# Patient Record
Sex: Male | Born: 1970 | Hispanic: No | Marital: Single | Smoking: Current every day smoker
Health system: Southern US, Community
[De-identification: ages and names within clinical notes are randomized; demographics above are authoritative.]

## PROBLEM LIST (undated history)

## (undated) DIAGNOSIS — E119 Type 2 diabetes mellitus without complications: Secondary | ICD-10-CM

---

## 2018-12-27 ENCOUNTER — Encounter (HOSPITAL_COMMUNITY): Payer: Self-pay | Admitting: Emergency Medicine

## 2018-12-27 ENCOUNTER — Emergency Department (HOSPITAL_COMMUNITY): Payer: Self-pay

## 2018-12-27 ENCOUNTER — Emergency Department (HOSPITAL_COMMUNITY)
Admission: EM | Admit: 2018-12-27 | Discharge: 2018-12-27 | Disposition: A | Discharge status: Home / Self Care | Payer: Self-pay | Attending: Emergency Medicine | Admitting: Emergency Medicine

## 2018-12-27 ENCOUNTER — Other Ambulatory Visit: Payer: Self-pay

## 2018-12-27 DIAGNOSIS — S92022A Displaced fracture of anterior process of left calcaneus, initial encounter for closed fracture: Secondary | ICD-10-CM | POA: Insufficient documentation

## 2018-12-27 DIAGNOSIS — F17210 Nicotine dependence, cigarettes, uncomplicated: Secondary | ICD-10-CM | POA: Insufficient documentation

## 2018-12-27 DIAGNOSIS — Y999 Unspecified external cause status: Secondary | ICD-10-CM | POA: Insufficient documentation

## 2018-12-27 DIAGNOSIS — Y929 Unspecified place or not applicable: Secondary | ICD-10-CM | POA: Insufficient documentation

## 2018-12-27 DIAGNOSIS — W132XXA Fall from, out of or through roof, initial encounter: Secondary | ICD-10-CM | POA: Insufficient documentation

## 2018-12-27 DIAGNOSIS — S92002A Unspecified fracture of left calcaneus, initial encounter for closed fracture: Secondary | ICD-10-CM

## 2018-12-27 DIAGNOSIS — E119 Type 2 diabetes mellitus without complications: Secondary | ICD-10-CM | POA: Insufficient documentation

## 2018-12-27 DIAGNOSIS — Y9389 Activity, other specified: Secondary | ICD-10-CM | POA: Insufficient documentation

## 2018-12-27 HISTORY — DX: Type 2 diabetes mellitus without complications: E11.9

## 2018-12-27 MED ORDER — IBUPROFEN 200 MG PO TABS
600.0000 mg | ORAL_TABLET | Freq: Once | ORAL | Status: AC
  Administered 2018-12-27: 600 mg via ORAL
  Filled 2018-12-27: qty 3

## 2018-12-27 MED ORDER — OXYCODONE-ACETAMINOPHEN 5-325 MG PO TABS
1.0000 | ORAL_TABLET | Freq: Once | ORAL | Status: AC
  Administered 2018-12-27: 1 via ORAL
  Filled 2018-12-27: qty 1

## 2018-12-27 MED ORDER — OXYCODONE-ACETAMINOPHEN 5-325 MG PO TABS: 1.0000 | ORAL_TABLET | ORAL | 0 refills | Status: AC | PRN

## 2018-12-27 NOTE — ED Provider Notes (Signed)
[facility]-EMERGENCY DEPT Provider Note   CSN: [id number] Arrival date & time: 12/27/18  0813     History   Chief Complaint Chief Complaint  Patient presents with  . Fall  . Foot Injury    HPI Nicholas Richardson is a 48 y.o. male.     HPI Patient is a 48 year old male who presents to the emergency department complaints of left ankle and heel pain.  He fell from a roof while attempting to access the roof by ladder when the ladder slid out.  He fell onto his left ankle.  He also developed abrasions to his right knee and his right forearm.  No head injury.  No back pain.  No weakness of his arms or legs.  No hip or knee discomfort.  Denies pain in his upper extremities.  His pain is focused and localized in the left foot and ankle.  His pain is moderate in severity.  Past Medical History:  Diagnosis Date  . Diabetes mellitus without complication (HCC)     There are no active problems to display for this patient.   History reviewed. No pertinent surgical history.      Home Medications    Prior to Admission medications   Medication Sig Start Date End Date Taking? Authorizing Provider  glipiZIDE (GLUCOTROL XL) 10 MG 24 hr tablet Take 10 mg by mouth 2 (two) times a day. 12/21/18  Yes [provider]  metFORMIN (GLUCOPHAGE) 1000 MG tablet Take 1,000 mg by mouth 2 (two) times a day. 12/21/18  Yes [provider]  oxyCODONE-acetaminophen (PERCOCET/ROXICET) 5-325 MG tablet Take 1 tablet by mouth every 4 (four) hours as needed for severe pain. 12/27/18   Azalia Bilisampos, Hailea Eaglin, MD    Family History No family history on file.  Social History Social History   Tobacco Use  . Smoking status: Current Every Day Smoker    Types: Cigarettes  . Smokeless tobacco: Never Used  Substance Use Topics  . Alcohol use: Not on file  . Drug use: Not on file     Allergies   Penicillins   Review of Systems Review of Systems  All other systems reviewed and are  negative.    Physical Exam Updated Vital Signs BP (!) 144/84   Pulse 93   Temp 98.8 F (37.1 C) (Oral)   Resp 18   Ht 6' (1.829 m)   Wt 122.5 kg   SpO2 98%   BMI 36.62 kg/m   Physical Exam Vitals signs and nursing note reviewed.  Constitutional:      Appearance: He is well-developed.  HENT:     Head: Normocephalic.  Neck:     Musculoskeletal: Normal range of motion.  Pulmonary:     Effort: Pulmonary effort is normal.  Abdominal:     General: There is no distension.  Musculoskeletal:     Comments: Full range of motion of bilateral shoulders, elbows, wrist.  Full range of motion of bilateral hips, knees, right ankle.  Mild pain with range of motion of left ankle without obvious deformity.  No tenderness over the base of the left fifth metatarsal.  Normal PT and DP pulse in the left foot.  Compartments of the left foot and left lower extremity are soft.  Abrasion noted to the anterior right knee.  Abrasion noted to the anterior right forearm.  Neurological:     Mental Status: He is alert and oriented to person, place, and time.      ED Treatments /  Results  Labs (all labs ordered are listed, but only abnormal results are displayed) Labs Reviewed - No data to display  EKG    Radiology Dg Ankle Complete Left  Result Date: 12/27/2018 CLINICAL DATA:  Fall from roof approximately 15 feet with left ankle pain, initial encounter EXAM: LEFT ANKLE COMPLETE - 3+ VIEW COMPARISON:  None. FINDINGS: There is a vertical fracture through the anterior process of the calcaneus with mild displacement identified. No other fracture is seen. No gross soft tissue abnormality is noted. IMPRESSION: Fracture through the anterior process of the calcaneus with mild displacement. Electronically Signed   By: Inez Catalina M.D.   On: 12/27/2018 09:17   Dg Foot Complete Left  Result Date: 12/27/2018 CLINICAL DATA:  Recent fall from roof with foot pain, initial encounter EXAM: LEFT FOOT - COMPLETE 3+  VIEW COMPARISON:  None. FINDINGS: Similar to that seen on the ankle examination there is a vertical fracture through the anterior process of the calcaneus with mild displacement identified. Few small fracture fragments indicating comminution are noted. This extends into the talocalcaneal joint however does not appear to extend to the calcaneal cuboid articulation. IMPRESSION: Comminuted fracture through the anterior process of the calcaneus. CT may be helpful for further evaluation as clinically indicated. Electronically Signed   By: Inez Catalina M.D.   On: 12/27/2018 09:18    Procedures .Splint Application  Date/Time: 12/27/2018 10:38 AM Performed by: Jola Schmidt, MD Authorized by: Jola Schmidt, MD    SPLINT APPLICATION Authorized by: Jola Schmidt Consent: Verbal consent obtained. Risks and benefits: risks, benefits and alternatives were discussed Consent given by: patient Splint applied by: orthopedic technician Location details: left lower extremity Splint type: short leg with bulky dressing Supplies used: orthoglass Post-procedure: The splinted body part was neurovascularly unchanged following the procedure. Patient tolerance: Patient tolerated the procedure well with no immediate complications.     Medications Ordered in ED Medications  ibuprofen (ADVIL) tablet 600 mg (600 mg Oral Given 12/27/18 0907)  oxyCODONE-acetaminophen (PERCOCET/ROXICET) 5-325 MG per tablet 1 tablet (1 tablet Oral Given 12/27/18 0908)     Initial Impression / Assessment and Plan / ED Course  I have reviewed the triage vital signs and the nursing notes.  Pertinent labs & imaging results that were available during my care of the patient were reviewed by me and considered in my medical decision making (see chart for details).        Mildly displaced left calcaneal fracture.  Compartments soft.  Pulses normal.  Foot elevated.  Bulky dressing placed.  Nonweight bearing status.  Orthopedic follow-up.   Patient understands to return to the ER for new or worsening symptoms.  I personally viewed the patient's x-ray.  CT scan obtained to the emergency department for operative management planning  Final Clinical Impressions(s) / ED Diagnoses   Final diagnoses:  Closed displaced fracture of left calcaneus, unspecified portion of calcaneus, initial encounter    ED Discharge Orders         Ordered    oxyCODONE-acetaminophen (PERCOCET/ROXICET) 5-325 MG tablet  Every 4 hours PRN     12/27/18 1034           Jola Schmidt, MD 12/27/18 1039

## 2018-12-27 NOTE — Discharge Instructions (Addendum)
No weight bearing on your left leg  Please call the orthopedist for follow up  Keep you leg elevated as much as possible

## 2018-12-27 NOTE — ED Triage Notes (Addendum)
Pt was partially on ladder and roof when ladder came out from under him and he fell about 15 feet. Reports cant feel left foot. Has abrasions on arms, denies hitting head or LOC. Denies taking blood thinners,

## 2020-08-26 IMAGING — DX LEFT FOOT - COMPLETE 3+ VIEW
3 series · 3 of 3 positions shown · non-contrast
Comparison: None.

CLINICAL DATA: Recent fall from roof with foot pain, initial
encounter

EXAM:
LEFT FOOT - COMPLETE 3+ VIEW

[foot ap]
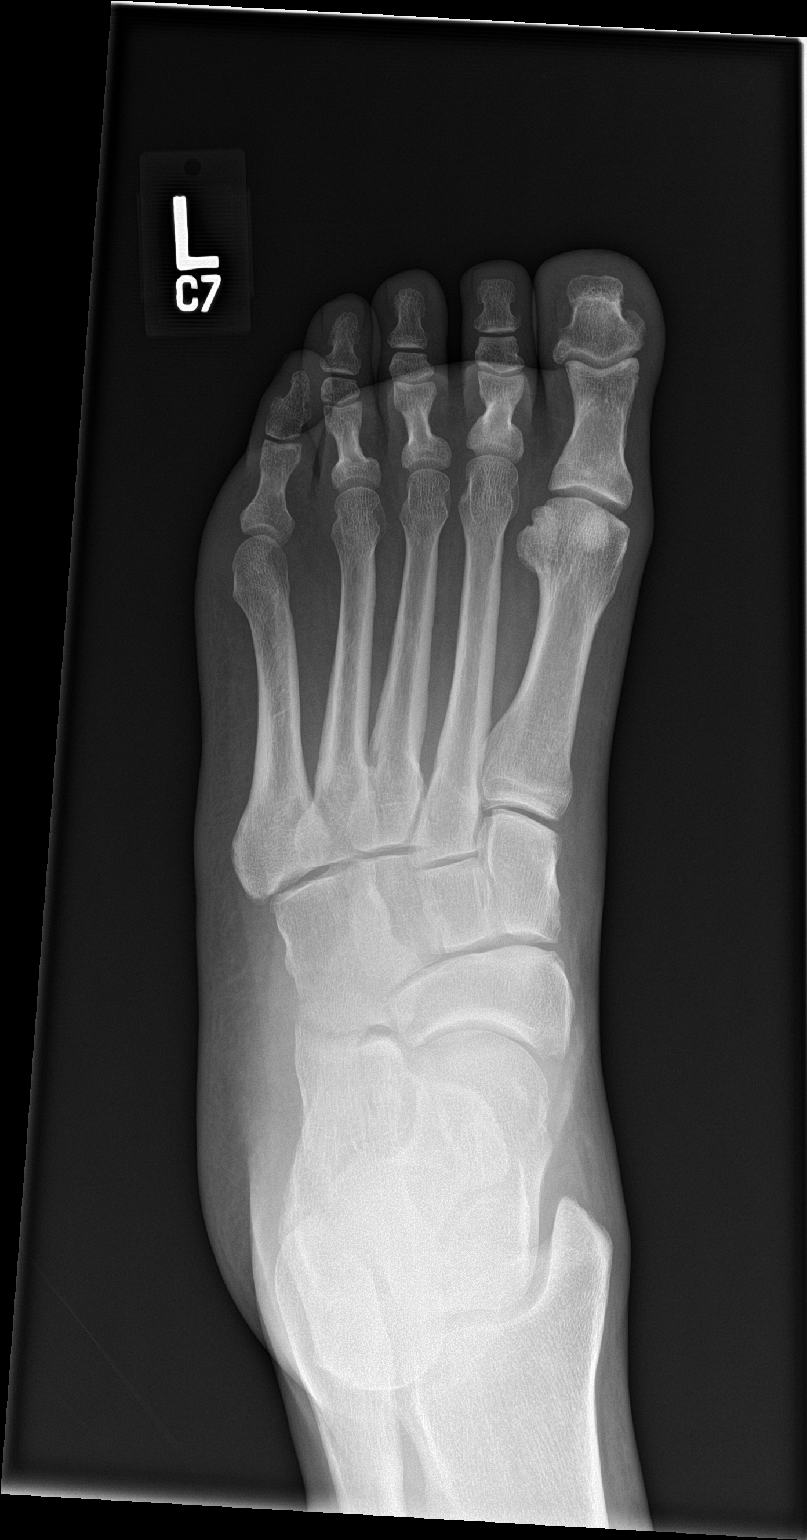

[foot obl]
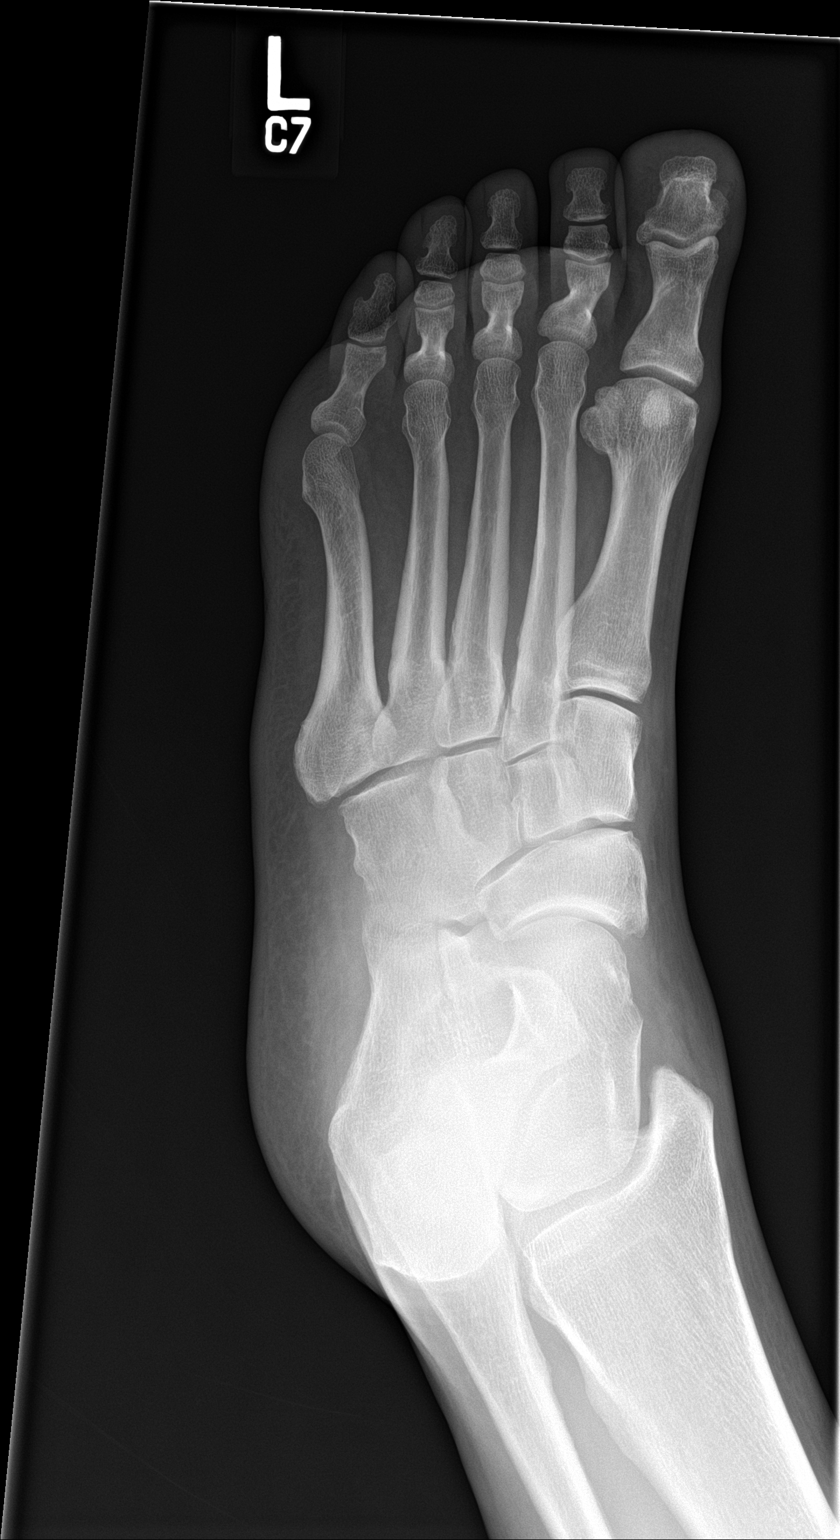

[foot lat]
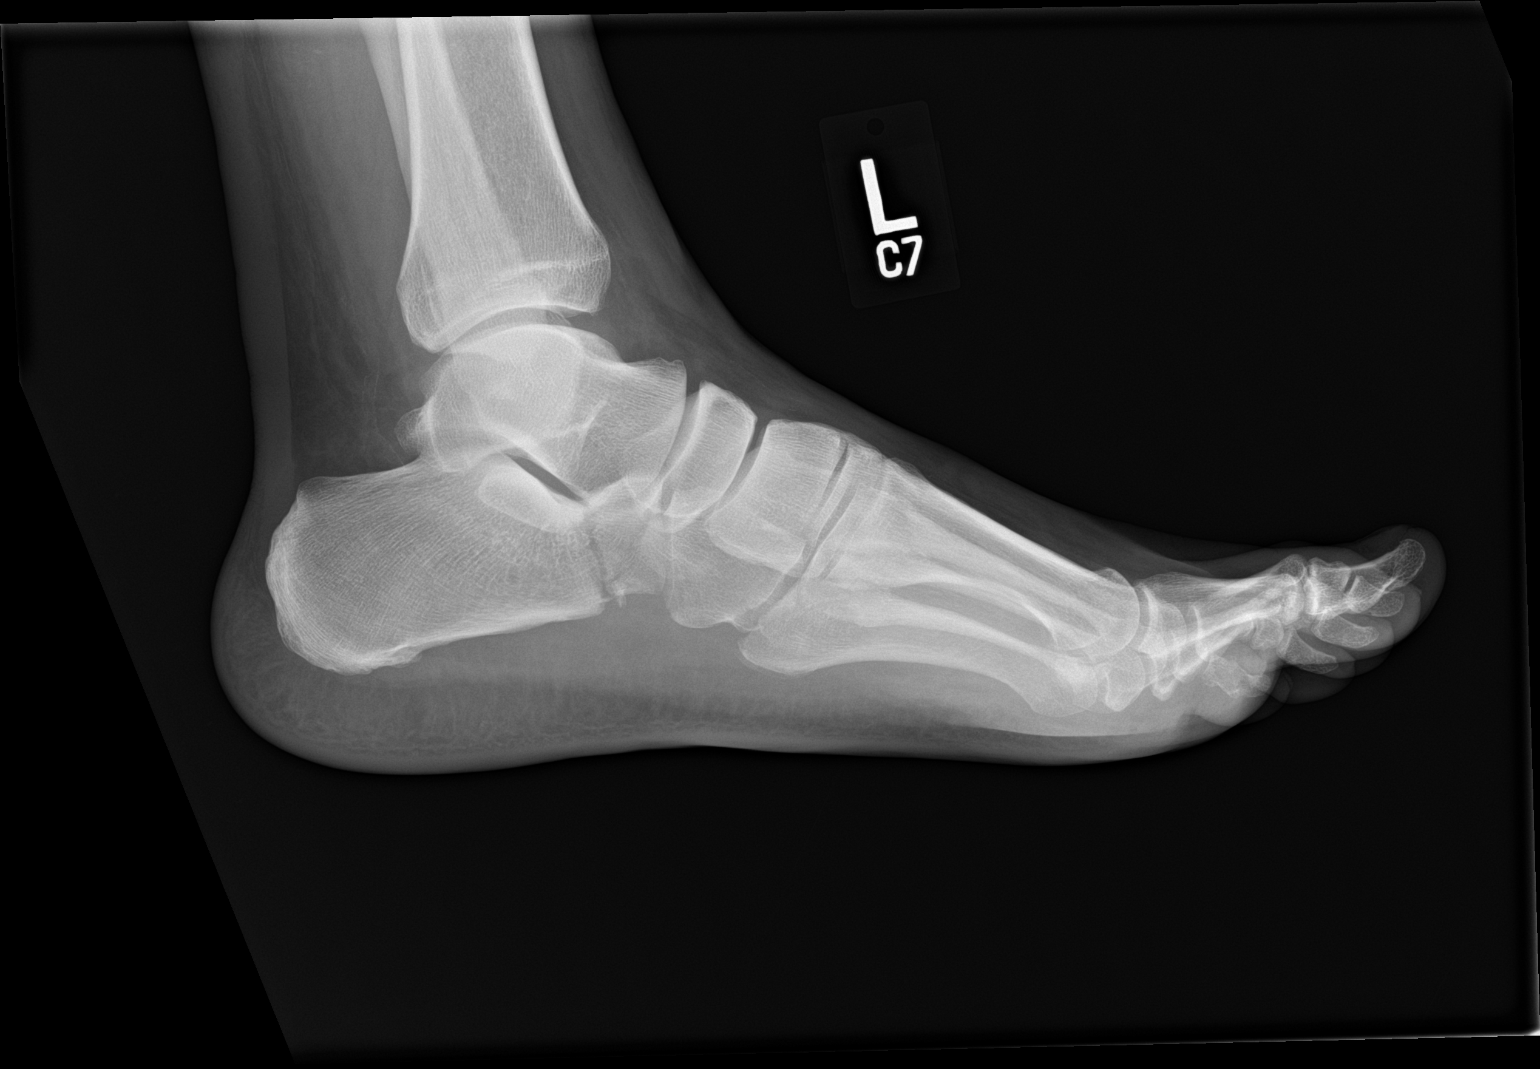

[3 of 3 positions shown; findings below may reference images not displayed]

FINDINGS: Similar to that seen on the ankle examination there is a vertical
fracture through the anterior process of the calcaneus with mild
displacement identified. Few small fracture fragments indicating
comminution are noted. This extends into the talocalcaneal joint
however does not appear to extend to the calcaneal cuboid
articulation.
IMPRESSION: Comminuted fracture through the anterior process of the calcaneus.
CT may be helpful for further evaluation as clinically indicated.
